# Patient Record
Sex: Male | Born: 1970 | Race: White | Hispanic: No | Marital: Single | Smoking: Never smoker
Health system: Southern US, Community
[De-identification: ages and names within clinical notes are randomized; demographics above are authoritative.]

## PROBLEM LIST (undated history)

## (undated) DIAGNOSIS — G8929 Other chronic pain: Secondary | ICD-10-CM

## (undated) DIAGNOSIS — M549 Dorsalgia, unspecified: Secondary | ICD-10-CM

## (undated) HISTORY — PX: OTHER SURGICAL HISTORY: SHX169

---

## 2008-03-12 ENCOUNTER — Emergency Department: Payer: Self-pay | Admitting: Emergency Medicine

## 2008-03-13 ENCOUNTER — Emergency Department (HOSPITAL_COMMUNITY): Admission: EM | Admit: 2008-03-13 | Discharge: 2008-03-13 | Payer: Self-pay | Admitting: Emergency Medicine

## 2010-05-04 LAB — CBC
RBC: 5.08 MIL/uL (ref 4.22–5.81)
WBC: 12.4 10*3/uL — ABNORMAL HIGH (ref 4.0–10.5)

## 2010-05-04 LAB — HEPATIC FUNCTION PANEL
AST: 37 U/L (ref 0–37)
Albumin: 3.7 g/dL (ref 3.5–5.2)
Alkaline Phosphatase: 56 U/L (ref 39–117)
Total Bilirubin: 2.2 mg/dL — ABNORMAL HIGH (ref 0.3–1.2)
Total Protein: 6.7 g/dL (ref 6.0–8.3)

## 2010-05-04 LAB — POCT I-STAT, CHEM 8
BUN: 13 mg/dL (ref 6–23)
Calcium, Ion: 1.01 mmol/L — ABNORMAL LOW (ref 1.12–1.32)
Chloride: 97 mEq/L (ref 96–112)
Glucose, Bld: 122 mg/dL — ABNORMAL HIGH (ref 70–99)
Potassium: 2.8 mEq/L — ABNORMAL LOW (ref 3.5–5.1)

## 2010-05-04 LAB — DIFFERENTIAL
Lymphocytes Relative: 15 % (ref 12–46)
Lymphs Abs: 1.9 10*3/uL (ref 0.7–4.0)
Monocytes Relative: 10 % (ref 3–12)
Neutro Abs: 9.3 10*3/uL — ABNORMAL HIGH (ref 1.7–7.7)
Neutrophils Relative %: 75 % (ref 43–77)

## 2010-05-04 LAB — URINALYSIS, ROUTINE W REFLEX MICROSCOPIC
Bilirubin Urine: NEGATIVE
Glucose, UA: NEGATIVE mg/dL
Ketones, ur: 40 mg/dL — AB
Specific Gravity, Urine: 1.024 (ref 1.005–1.030)
pH: 7 (ref 5.0–8.0)

## 2015-04-14 ENCOUNTER — Inpatient Hospital Stay
Admission: EM | Admit: 2015-04-14 | Discharge: 2015-04-16 | DRG: 603 | Disposition: A | Discharge status: Home / Self Care | Payer: Medicaid Other | Attending: Internal Medicine | Admitting: Internal Medicine

## 2015-04-14 ENCOUNTER — Encounter: Payer: Self-pay | Admitting: Emergency Medicine

## 2015-04-14 DIAGNOSIS — R609 Edema, unspecified: Secondary | ICD-10-CM

## 2015-04-14 DIAGNOSIS — L03115 Cellulitis of right lower limb: Principal | ICD-10-CM | POA: Diagnosis present

## 2015-04-14 DIAGNOSIS — M545 Low back pain: Secondary | ICD-10-CM | POA: Diagnosis present

## 2015-04-14 DIAGNOSIS — G8929 Other chronic pain: Secondary | ICD-10-CM | POA: Diagnosis present

## 2015-04-14 DIAGNOSIS — Z8052 Family history of malignant neoplasm of bladder: Secondary | ICD-10-CM

## 2015-04-14 HISTORY — DX: Dorsalgia, unspecified: M54.9

## 2015-04-14 HISTORY — DX: Other chronic pain: G89.29

## 2015-04-14 LAB — CBC WITH DIFFERENTIAL/PLATELET
BASOS ABS: 0.1 10*3/uL (ref 0–0.1)
BASOS PCT: 0 %
EOS ABS: 0.1 10*3/uL (ref 0–0.7)
Eosinophils Relative: 1 %
HCT: 40.6 % (ref 40.0–52.0)
HEMOGLOBIN: 14 g/dL (ref 13.0–18.0)
Lymphocytes Relative: 11 %
Lymphs Abs: 1.5 10*3/uL (ref 1.0–3.6)
MCH: 29.8 pg (ref 26.0–34.0)
MCHC: 34.4 g/dL (ref 32.0–36.0)
MCV: 86.7 fL (ref 80.0–100.0)
MONOS PCT: 12 %
Monocytes Absolute: 1.7 10*3/uL — ABNORMAL HIGH (ref 0.2–1.0)
NEUTROS PCT: 76 %
Neutro Abs: 11.1 10*3/uL — ABNORMAL HIGH (ref 1.4–6.5)
Platelets: 154 10*3/uL (ref 150–440)
RBC: 4.68 MIL/uL (ref 4.40–5.90)
RDW: 13 % (ref 11.5–14.5)
WBC: 14.5 10*3/uL — AB (ref 3.8–10.6)

## 2015-04-14 LAB — BASIC METABOLIC PANEL
Anion gap: 7 (ref 5–15)
BUN: 14 mg/dL (ref 6–20)
CHLORIDE: 103 mmol/L (ref 101–111)
CO2: 26 mmol/L (ref 22–32)
CREATININE: 0.87 mg/dL (ref 0.61–1.24)
Calcium: 8.7 mg/dL — ABNORMAL LOW (ref 8.9–10.3)
GFR calc non Af Amer: >60 mL/min (ref >60)
Glucose, Bld: 97 mg/dL (ref 65–99)
POTASSIUM: 4 mmol/L (ref 3.5–5.1)
SODIUM: 136 mmol/L (ref 135–145)

## 2015-04-14 MED ORDER — ENOXAPARIN SODIUM 40 MG/0.4ML ~~LOC~~ SOLN
40.0000 mg | SUBCUTANEOUS | Status: DC
  Administered 2015-04-15: 40 mg via SUBCUTANEOUS
  Filled 2015-04-14: qty 0.4

## 2015-04-14 MED ORDER — ACETAMINOPHEN 325 MG PO TABS: 650.0000 mg | ORAL_TABLET | Freq: Four times a day (QID) | ORAL | Status: DC | PRN

## 2015-04-14 MED ORDER — SODIUM CHLORIDE 0.9 % IV SOLN
INTRAVENOUS | Status: DC
  Administered 2015-04-14: 23:00:00 via INTRAVENOUS

## 2015-04-14 MED ORDER — SODIUM CHLORIDE 0.9 % IV SOLN
3.0000 g | Freq: Four times a day (QID) | INTRAVENOUS | Status: DC
  Administered 2015-04-14 – 2015-04-15 (×3): 3 g via INTRAVENOUS
  Filled 2015-04-14 (×6): qty 3

## 2015-04-14 MED ORDER — HYDROCODONE-ACETAMINOPHEN 5-325 MG PO TABS
1.0000 | ORAL_TABLET | ORAL | Status: DC | PRN
  Administered 2015-04-15 (×2): 1 via ORAL
  Filled 2015-04-14 (×2): qty 1

## 2015-04-14 MED ORDER — VANCOMYCIN HCL IN DEXTROSE 1-5 GM/200ML-% IV SOLN
1000.0000 mg | Freq: Once | INTRAVENOUS | Status: AC
  Administered 2015-04-14: 1000 mg via INTRAVENOUS
  Filled 2015-04-14: qty 200

## 2015-04-14 MED ORDER — MORPHINE SULFATE (PF) 2 MG/ML IV SOLN: 1.0000 mg | INTRAVENOUS | Status: DC | PRN

## 2015-04-14 MED ORDER — ACETAMINOPHEN 650 MG RE SUPP: 650.0000 mg | Freq: Four times a day (QID) | RECTAL | Status: DC | PRN

## 2015-04-14 MED ORDER — ONDANSETRON HCL 4 MG/2ML IJ SOLN: 4.0000 mg | Freq: Four times a day (QID) | INTRAMUSCULAR | Status: DC | PRN

## 2015-04-14 MED ORDER — ONDANSETRON HCL 4 MG PO TABS: 4.0000 mg | ORAL_TABLET | Freq: Four times a day (QID) | ORAL | Status: DC | PRN

## 2015-04-14 MED ORDER — VANCOMYCIN HCL IN DEXTROSE 1-5 GM/200ML-% IV SOLN
1000.0000 mg | Freq: Three times a day (TID) | INTRAVENOUS | Status: DC
  Administered 2015-04-14 – 2015-04-16 (×5): 1000 mg via INTRAVENOUS
  Filled 2015-04-14 (×7): qty 200

## 2015-04-14 MED ORDER — MORPHINE SULFATE (PF) 4 MG/ML IV SOLN
4.0000 mg | Freq: Once | INTRAVENOUS | Status: AC
  Administered 2015-04-14: 4 mg via INTRAVENOUS
  Filled 2015-04-14: qty 1

## 2015-04-14 MED ORDER — ONDANSETRON HCL 4 MG/2ML IJ SOLN
4.0000 mg | Freq: Once | INTRAMUSCULAR | Status: AC
  Administered 2015-04-14: 4 mg via INTRAVENOUS
  Filled 2015-04-14: qty 2

## 2015-04-14 NOTE — ED Notes (Signed)
Pt sent here from urgent care with right knee pain/redness/swelling. Pt reports yesterday he was giving a shot and pills of antibiotics and redness and swelling is still spreading.

## 2015-04-14 NOTE — ED Provider Notes (Signed)
[facility] Emergency Department Provider Note  ____________________________________________  Time seen: Approximately 3:51 PM  I have reviewed the triage vital signs and the nursing notes.   HISTORY  Chief Complaint Knee Pain    HPI Bascom LevelsMichael S Yanik is a 45 y.o. male with no chronic medical problems who presents for evaluation of 2 days right leg swelling, redness and pain, gradual onset, constant since onset, currently moderate, no modifying factors. Patient was seen at urgent care yesterday, diagnosed with cellulitis and discharged with PO clindamycin after receiving IV clindamycin however he reports that the redness has progressed beyond the borders that were marked by the urgent care physician. He has had subjective chills and fevers. No nausea, vomiting, diarrhea. No chest pain or difficulty breathing.   Past Medical History  Diagnosis Date  . Chronic back pain     Patient Active Problem List   Diagnosis Date Noted  . Cellulitis of right knee 04/14/2015    Past Surgical History  Procedure Laterality Date  . None      Current Outpatient Rx  Name  Route  Sig  Dispense  Refill  . clindamycin (CLEOCIN) 300 MG capsule   Oral   Take 300 mg by mouth 3 (three) times daily. For 10 days           Allergies Review of patient's allergies indicates no known allergies.  Family History  Problem Relation Age of Onset  . Bladder Cancer Mother     Social History Social History  Substance Use Topics  . Smoking status: Never Smoker   . Smokeless tobacco: None  . Alcohol Use: No    Review of Systems Constitutional: + fever/chills Eyes: No visual changes. ENT: No sore throat. Cardiovascular: Denies chest pain. Respiratory: Denies shortness of breath. Gastrointestinal: No abdominal pain.  No nausea, no vomiting.  No diarrhea.  No constipation. Genitourinary: Negative for dysuria. Musculoskeletal: Negative for back pain. Skin: Negative for  rash. Neurological: Negative for headaches, focal weakness or numbness.  10-point ROS otherwise negative.  ____________________________________________   PHYSICAL EXAM:  VITAL SIGNS: ED Triage Vitals  Enc Vitals Group     BP 04/14/15 1411 146/79 mmHg     Pulse Rate 04/14/15 1411 85     Resp 04/14/15 1411 16     Temp 04/14/15 1411 98.1 F (36.7 C)     Temp Source 04/14/15 1411 Oral     SpO2 04/14/15 1411 97 %     Weight 04/14/15 1411 210 lb (95.255 kg)     Height 04/14/15 1411 6\' 3"  (1.905 m)     Head Cir --      Peak Flow --      Pain Score 04/14/15 1412 8     Pain Loc --      Pain Edu? --      Excl. in GC? --     Constitutional: Alert and oriented. Well appearing and in no acute distress. Eyes: Conjunctivae are normal. PERRL. EOMI. Head: Atraumatic. Nose: No congestion/rhinnorhea. Mouth/Throat: Mucous membranes are moist.  Oropharynx non-erythematous. Neck: No stridor.  Supple without meningismus. Cardiovascular: Normal rate, regular rhythm. Grossly normal heart sounds.  Good peripheral circulation. Respiratory: Normal respiratory effort.  No retractions. Lungs CTAB. Gastrointestinal: Soft and nontender. No distention.  No CVA tenderness. Genitourinary: Deferred Musculoskeletal: There is an area of warmth, erythema, induration associated with the lower aspect of the right knee and the proximal aspect of the right lower leg. 2+ right DP pulse. Neurologic:  Normal  speech and language. No gross focal neurologic deficits are appreciated.  Skin:  Skin is warm, dry and intact. No rash noted. Psychiatric: Mood and affect are normal. Speech and behavior are normal.  ____________________________________________   LABS (all labs ordered are listed, but only abnormal results are displayed)  Labs Reviewed  CBC WITH DIFFERENTIAL/PLATELET - Abnormal; Notable for the following:    WBC 14.5 (*)    Neutro Abs 11.1 (*)    Monocytes Absolute 1.7 (*)    All other components within  normal limits  BASIC METABOLIC PANEL - Abnormal; Notable for the following:    Calcium 8.7 (*)    All other components within normal limits  CULTURE, BLOOD (ROUTINE X 2)  CULTURE, BLOOD (ROUTINE X 2)  CBC  CREATININE, SERUM  CBC  BASIC METABOLIC PANEL   ____________________________________________  EKG  none ____________________________________________  RADIOLOGY  none ____________________________________________   PROCEDURES  Procedure(s) performed: None  Critical Care performed: No  ____________________________________________   INITIAL IMPRESSION / ASSESSMENT AND PLAN / ED COURSE  Pertinent labs & imaging results that were available during my care of the patient were reviewed by me and considered in my medical decision making (see chart for details).  45 year old well with no chronic medical problems presenting with worsening right lower extremity cellulitis despite IV and by mouth clindamycin. He is also had subjective fevers and Chills. He does have leukocytosis here with a white blood cell count of approximately 14,000. Has full flexion and extension at the knee and though the cellulitis involves the inferior aspect of the right knee I do not suspect intra-articular involvement/septic joint. Blood cultures ordered. IV vancomycin ordered. Case discussed with the hospitalist, Dr. Allena Katz, for admission at 5:40 PM.   ____________________________________________   FINAL CLINICAL IMPRESSION(S) / ED DIAGNOSES  Final diagnoses:  Cellulitis of knee, right      Gayla Doss, MD 04/14/15 [id number]

## 2015-04-14 NOTE — Progress Notes (Signed)
Pharmacy Antibiotic Note  Tommy LevelsMichael S Baker is a 45 y.o. male admitted on 04/14/2015 with cellulitis failing outpatient abx.  Pharmacy has been consulted for vancomycin dosing.  Plan: Vancomycin 1000 IV every 8 hours with stacked dosing.  Goal trough 10-15 mcg/mL. Trough with the 5th total dose.   Unasyn 3 g iv q 6 hours.   Height: 6\' 3"  (190.5 cm) Weight: 210 lb (95.255 kg) IBW/kg (Calculated) : 84.5  Temp (24hrs), Avg:98.1 F (36.7 C), Min:98.1 F (36.7 C), Max:98.1 F (36.7 C)   Recent Labs Lab 04/14/15 1553  WBC 14.5*  CREATININE 0.87    Estimated Creatinine Clearance: 128.2 mL/min (by C-G formula based on Cr of 0.87).    No Known Allergies  Antimicrobials this admission: vancomycin 3/28 >>  Unasyn 3/28 >>   Dose adjustments this admission:   Microbiology results: 3/28 BCx: pending  Thank you for allowing pharmacy to be a part of this patient's care.  Tommy HartChristy, Tommy Baker 04/14/2015 6:06 PM

## 2015-04-14 NOTE — ED Notes (Addendum)
Pt reports Sunday morning he noticed he was unable to bear weight on his knee after working. Pt's R knee is swollen with redness. Was seen at urgent care and Dx with cellulitis. States leg is too stiff/painful to move easily.

## 2015-04-14 NOTE — H&P (Signed)
[facility] [place] [facility]   PATIENT NAME: Tommy GreaserMichael Baker    MR#:  [id number]  DATE OF BIRTH:  [birth year]  DATE OF ADMISSION:  04/14/2015  PRIMARY CARE PHYSICIAN: No primary care provider on file.   REQUESTING/REFERRING PHYSICIAN: Dr. Inocencio HomesGayle  CHIEF COMPLAINT:   Chief Complaint  Patient presents with  . Knee Pain    HISTORY OF PRESENT ILLNESS: Tommy GreaserMichael Baker  is a 45 y.o. 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 He states that he was putting the board last week on the floor and was on his knee. On Sunday he developed swelling and erythema in the knee the pain continued to persist. Therefore he was seen at a [facility] and was started on clindamycin. Patient's pain continued to get worse and the knee. Swelling continue to persist. And redness persisted therefore he comes to the ER. He is noted to have elevated WBC count. This is knee pain is not complaining of any chest pain or shortness of breath he is able to ambulate.  PAST MEDICAL HISTORY:   Past Medical History  Diagnosis Date  . Chronic back pain     PAST SURGICAL HISTORY: Past Surgical History  Procedure Laterality Date  . None      SOCIAL HISTORY:  Social History  Substance Use Topics  . Smoking status: Never Smoker   . Smokeless tobacco: Not on file  . Alcohol Use: No    FAMILY HISTORY:  Family History  Problem Relation Age of Onset  . Bladder Cancer Mother     DRUG ALLERGIES: No Known Allergies  REVIEW OF SYSTEMS:   CONSTITUTIONAL: No fever, fatigue or weakness.  EYES: No blurred or double vision.  EARS, NOSE, AND THROAT: No tinnitus or ear pain.  RESPIRATORY: No cough, shortness of breath, wheezing or hemoptysis.  CARDIOVASCULAR: No chest pain, orthopnea, edema.  GASTROINTESTINAL: No nausea, vomiting, diarrhea or abdominal pain.  GENITOURINARY: No dysuria, hematuria.  ENDOCRINE: No  polyuria, nocturia,  HEMATOLOGY: No anemia, easy bruising or bleeding SKIN: Right knee swelling and erythema MUSCULOSKELETAL: Right knee joint pain NEUROLOGIC: No tingling, numbness, weakness.  PSYCHIATRY: No anxiety or depression.   MEDICATIONS AT HOME:  Prior to Admission medications   Not on File      PHYSICAL EXAMINATION:   VITAL SIGNS: Blood pressure 132/68, pulse 78, temperature 98.1 F (36.7 C), temperature source Oral, resp. rate 16, height 6\' 3"  (1.905 m), weight 95.255 kg (210 lb), SpO2 99 %.  GENERAL:  45 y.o.-year-old patient lying in the bed with no acute distress.  EYES: Pupils equal, round, reactive to light and accommodation. No scleral icterus. Extraocular muscles intact.  HEENT: Head atraumatic, normocephalic. Oropharynx and nasopharynx clear.  NECK:  Supple, no jugular venous distention. No thyroid enlargement, no tenderness.  LUNGS: Normal breath sounds bilaterally, no wheezing, rales,rhonchi or crepitation. No use of accessory muscles of respiration.  CARDIOVASCULAR: S1, S2 normal. No murmurs, rubs, or gallops.  ABDOMEN: Soft, nontender, nondistended. Bowel sounds present. No organomegaly or mass.  EXTREMITIES: No clubbing, cyanosis or edema  NEUROLOGIC: Cranial nerves II through XII are intact. Muscle strength 5/5 in all extremities. Sensation intact. Gait not checked.  PSYCHIATRIC: The patient is alert and oriented x 3.  SKIN: Right knee is warm to touch erythematous mild swelling   LABORATORY PANEL:   CBC  Recent Labs Lab 04/14/15 1553  WBC 14.5*  HGB 14.0  HCT 40.6  PLT 154  MCV 86.7  MCH 29.8  MCHC 34.4  RDW 13.0  LYMPHSABS 1.5  MONOABS 1.7*  EOSABS 0.1  BASOSABS 0.1   ------------------------------------------------------------------------------------------------------------------  Chemistries   Recent Labs Lab 04/14/15 1553  NA 136  K 4.0  CL 103  CO2 26  GLUCOSE 97  BUN 14  CREATININE 0.87  CALCIUM 8.7*    ------------------------------------------------------------------------------------------------------------------ estimated creatinine clearance is 128.2 mL/min (by C-G formula based on Cr of 0.87). ------------------------------------------------------------------------------------------------------------------ No results for input(s): TSH, T4TOTAL, T3FREE, THYROIDAB in the last 72 hours.  Invalid input(s): FREET3   Coagulation profile No results for input(s): INR, PROTIME in the last 168 hours. ------------------------------------------------------------------------------------------------------------------- No results for input(s): DDIMER in the last 72 hours. -------------------------------------------------------------------------------------------------------------------  Cardiac Enzymes No results for input(s): CKMB, TROPONINI, MYOGLOBIN in the last 168 hours.  Invalid input(s): CK ------------------------------------------------------------------------------------------------------------------ Invalid input(s): POCBNP  ---------------------------------------------------------------------------------------------------------------  Urinalysis    Component Value Date/Time   COLORURINE AMBER BIOCHEMICALS MAY BE AFFECTED BY COLOR* 03/13/2008 0304   APPEARANCEUR CLOUDY* 03/13/2008 0304   LABSPEC 1.024 03/13/2008 0304   PHURINE 7.0 03/13/2008 0304   GLUCOSEU NEGATIVE 03/13/2008 0304   HGBUR NEGATIVE 03/13/2008 0304   BILIRUBINUR NEGATIVE 03/13/2008 0304   KETONESUR 40* 03/13/2008 0304   PROTEINUR NEGATIVE 03/13/2008 0304   UROBILINOGEN 1.0 03/13/2008 0304   NITRITE NEGATIVE 03/13/2008 0304   LEUKOCYTESUR  03/13/2008 0304    NEGATIVE MICROSCOPIC NOT DONE ON URINES WITH NEGATIVE PROTEIN, BLOOD, LEUKOCYTES, NITRITE, OR GLUCOSE <1000 mg/dL.     RADIOLOGY: No results found.  EKG: No orders found for this or any previous visit.  IMPRESSION AND PLAN: Patient is a  45 year old white male presents with right knee cellulitis  1. Right knee cellulitis at this time will treat with broad-spectrum antibiotics with vancomycin and Unasyn follow blood cultures  2. Leukocytosis due to cellulitis follow CBC  3. Miscellaneous Lovenox for DVT prophylaxis All the records are reviewed and case discussed with ED provider. Management plans discussed with the patient, family and they are in agreement.  CODE STATUS:full     Code Status Orders        Start     Ordered   04/14/15 1747  Full code   Continuous     04/14/15 1747    Code Status History    Date Active Date Inactive Code Status Order ID Comments User Context   This patient has a current code status but no historical code status.       TOTAL TIME TAKING CARE OF THIS PATIENT:55 minutes.    Auburn Bilberry M.D on 04/14/2015 at 5:54 PM  Between 7am to 6pm - Pager - [phone]  After 6pm go to [email or web address] - password [email or web address] [facility]  [place] [facility]  Office  [phone]  CC: Primary care physician; No primary care provider on file.

## 2015-04-15 ENCOUNTER — Inpatient Hospital Stay: Payer: Medicaid Other

## 2015-04-15 LAB — BASIC METABOLIC PANEL
Anion gap: 7 (ref 5–15)
BUN: 12 mg/dL (ref 6–20)
CO2: 26 mmol/L (ref 22–32)
CREATININE: 0.81 mg/dL (ref 0.61–1.24)
Calcium: 8.2 mg/dL — ABNORMAL LOW (ref 8.9–10.3)
Chloride: 105 mmol/L (ref 101–111)
GFR calc Af Amer: >60 mL/min (ref >60)
Glucose, Bld: 108 mg/dL — ABNORMAL HIGH (ref 65–99)
Potassium: 3.8 mmol/L (ref 3.5–5.1)
SODIUM: 138 mmol/L (ref 135–145)

## 2015-04-15 LAB — CBC
HCT: 38.9 % — ABNORMAL LOW (ref 40.0–52.0)
Hemoglobin: 13.4 g/dL (ref 13.0–18.0)
MCH: 30.3 pg (ref 26.0–34.0)
MCHC: 34.5 g/dL (ref 32.0–36.0)
MCV: 87.8 fL (ref 80.0–100.0)
PLATELETS: 148 10*3/uL — AB (ref 150–440)
RBC: 4.43 MIL/uL (ref 4.40–5.90)
RDW: 13.2 % (ref 11.5–14.5)
WBC: 11.8 10*3/uL — ABNORMAL HIGH (ref 3.8–10.6)

## 2015-04-15 NOTE — Progress Notes (Signed)
[facility] - [place] at [facility]   PATIENT NAME: Tommy Baker    MR#:  [id number]  DATE OF BIRTH:  Jan 15, 1971  SUBJECTIVE:  CHIEF COMPLAINT:   Chief Complaint  Patient presents with  . Knee Pain   - works in [employer], was kneeling on his knees yesterday and noted to have swelling, redness of right knee - failed outpatient clindamycin - Improving on IV ABX now  REVIEW OF SYSTEMS:  Review of Systems  Constitutional: Negative for fever and chills.  HENT: Negative for congestion, ear discharge and nosebleeds.   Eyes: Negative for blurred vision and double vision.  Respiratory: Negative for cough, shortness of breath and wheezing.   Cardiovascular: Positive for leg swelling. Negative for chest pain and palpitations.  Gastrointestinal: Negative for nausea, vomiting, abdominal pain, diarrhea and constipation.  Genitourinary: Negative for dysuria and urgency.  Musculoskeletal: Positive for joint pain. Negative for myalgias.       Right knee pain, swelling and erythema- Improving.  Skin: Negative for rash.  Neurological: Negative for dizziness, sensory change, speech change, focal weakness, seizures, weakness and headaches.  Psychiatric/Behavioral: Negative for depression.    DRUG ALLERGIES:  No Known Allergies  VITALS:  Blood pressure 108/67, pulse 65, temperature 98.4 F (36.9 C), temperature source Oral, resp. rate 18, height  (1.905 m), weight 95.255 kg (210 lb), SpO2 95 %.  PHYSICAL EXAMINATION:  Physical Exam  GENERAL:  45 y.o.-year-old patient lying in the bed with no acute distress.  EYES: Pupils equal, round, reactive to light and accommodation. No scleral icterus. Extraocular muscles intact.  HEENT: Head atraumatic, normocephalic. Oropharynx and nasopharynx clear.  NECK:  Supple, no jugular venous distention. No thyroid enlargement, no tenderness.  LUNGS: Normal breath sounds bilaterally, no wheezing, rales,rhonchi or  crepitation. No use of accessory muscles of respiration.  CARDIOVASCULAR: S1, S2 normal. No murmurs, rubs, or gallops.  ABDOMEN: Soft, nontender, nondistended. Bowel sounds present. No organomegaly or mass.  EXTREMITIES: Right knee erythema, swelling anteriorly on the knee extending inferiorly- improving since yesterday. No pedal edema, cyanosis, or clubbing.  NEUROLOGIC: Cranial nerves II through XII are intact. Muscle strength 5/5 in all extremities. Sensation intact. Gait not checked.  PSYCHIATRIC: The patient is alert and oriented x 3.  SKIN: No obvious rash, lesion, or ulcer.    LABORATORY PANEL:   CBC  Recent Labs Lab 04/15/15 0438  WBC 11.8*  HGB 13.4  HCT 38.9*  PLT 148*   ------------------------------------------------------------------------------------------------------------------  Chemistries   Recent Labs Lab 04/15/15 0438  NA 138  K 3.8  CL 105  CO2 26  GLUCOSE 108*  BUN 12  CREATININE 0.81  CALCIUM 8.2*   ------------------------------------------------------------------------------------------------------------------  Cardiac Enzymes No results for input(s): TROPONINI in the last 168 hours. ------------------------------------------------------------------------------------------------------------------  RADIOLOGY:  No results found.  EKG:  No orders found for this or any previous visit.  ASSESSMENT AND PLAN:   45 year old white male with PMH significant for chronic low back pain  presents with right knee cellulitis  1. Right knee cellulitis: secondary to recurrent trauma- kneels on his knees - X ray pending to r/o joint effusion and involvement - Blood cultures negative so far - On vancomycin and unasyn, If MRSA pcr is negative- discontinue vancomcyin - Improving. Continue monitoring and discharge tomorrow - [place] ordered to r/o DVT and also any ruptured knee cysts - discontinue fluids today  2. Leukocytosis due to cellulitis-  improving  3. Chronic low back pain- prn pain meds  4. DVT  prophylaxis- lovenox    All the records are reviewed and case discussed with Care Management/Social Workerr. Management plans discussed with the patient, family and they are in agreement.  CODE STATUS: Full Code  TOTAL TIME TAKING CARE OF THIS PATIENT: 37 minutes.   POSSIBLE D/C IN 1-2 DAYS, DEPENDING ON CLINICAL CONDITION.   Enid BaasKALISETTI,Ardella Chhim M.D on 04/15/2015 at 11:56 AM  Between 7am to 6pm - Pager - [phone]  After 6pm go to [email or web address] - password [email or web address]  [facility]  Office  [phone]  CC: Primary care physician; No primary care provider on file.

## 2015-04-16 MED ORDER — HYDROCODONE-ACETAMINOPHEN 5-325 MG PO TABS: 1.0000 | ORAL_TABLET | Freq: Four times a day (QID) | ORAL | Status: AC | PRN

## 2015-04-16 MED ORDER — SULFAMETHOXAZOLE-TRIMETHOPRIM 800-160 MG PO TABS: 1.0000 | ORAL_TABLET | Freq: Two times a day (BID) | ORAL | Status: AC

## 2015-04-16 NOTE — Progress Notes (Signed)
Pharmacy Antibiotic Note  Tommy Baker is a 45 y.o. male admitted on 04/14/2015 with cellulitis failing outpatient abx.  Pharmacy has been consulted for vancomycin dosing.  Plan: Vanc trough was drawn while infusing on 3/29. Vanc trough was not drawn on the AM of 3/30 per nurse bc patient is being d/c home today. D/c not indicated pt will be d/c on bactrim.   Height: 6\' 3"  (190.5 cm) Weight: 210 lb (95.255 kg) IBW/kg (Calculated) : 84.5  Temp (24hrs), Avg:98.3 F (36.8 C), Min:98 F (36.7 C), Max:98.5 F (36.9 C)   Recent Labs Lab 04/14/15 1553 04/15/15 0438  WBC 14.5* 11.8*  CREATININE 0.87 0.81    Estimated Creatinine Clearance: 137.6 mL/min (by C-G formula based on Cr of 0.81).    No Known Allergies  Antimicrobials this admission: vancomycin 3/28 >> 3/30 Unasyn 3/28 >> 3/29 Bactrim 3/30>>  Dose adjustments this admission:   Microbiology results: 3/28 BCx: neg  Thank you for allowing pharmacy to be a part of this patient's care.  Tommy FlossMelissa D Zeek Rostron 04/16/2015 10:31 AM

## 2015-04-16 NOTE — Discharge Summary (Signed)
[facility] -  at [facility]   PATIENT NAME: Tommy Baker    MR#:  [id number]  DATE OF BIRTH:  [birth year]  DATE OF ADMISSION:  04/14/2015 ADMITTING PHYSICIAN: Auburn Bilberry, MD  DATE OF DISCHARGE: 04/16/15  PRIMARY CARE PHYSICIAN: No primary care provider on file.    ADMISSION DIAGNOSIS:  Cellulitis of knee, right [L03.115]  DISCHARGE DIAGNOSIS:  Active Problems:   Cellulitis of right knee   SECONDARY DIAGNOSIS:   Past Medical History  Diagnosis Date  . Chronic back pain     HOSPITAL COURSE:   45 year old white male with PMH significant for chronic low back pain presents with right knee cellulitis  1. Right knee cellulitis: secondary to recurrent trauma- kneels on his knees - X ray negative for any joint effusion and involvement, [place] negative for DVT - Blood cultures negative so far - On vancomycin here- change to bactrim at discharge - Improving.  - ice pack, weight bearing as tolerated and keep the leg elevated.  2. Leukocytosis due to cellulitis- improving  3. Chronic low back pain- prn pain meds  Patient will be discharged today, If no improvement in 1 week- come to ER or ortho f/u   DISCHARGE CONDITIONS:   stable  CONSULTS OBTAINED:    None  DRUG ALLERGIES:  No Known Allergies  DISCHARGE MEDICATIONS:   Current Discharge Medication List    START taking these medications   Details  HYDROcodone-acetaminophen (NORCO/VICODIN) 5-325 MG tablet Take 1-2 tablets by mouth every 6 (six) hours as needed for moderate pain. Qty: 20 tablet, Refills: 0    sulfamethoxazole-trimethoprim (BACTRIM DS) 800-160 MG tablet Take 1 tablet by mouth 2 (two) times daily. X 9 more days Qty: 18 tablet, Refills: 0      STOP taking these medications     clindamycin (CLEOCIN) 300 MG capsule          DISCHARGE INSTRUCTIONS:   1. PCP f/u in 1-2 weeks 2. Keep the right leg elevated 3. Ice pack to right knee atleast 3-4  times/day  If you experience worsening of your admission symptoms, develop shortness of breath, life threatening emergency, suicidal or homicidal thoughts you must seek medical attention immediately by calling 911 or calling your MD immediately  if symptoms less severe.  You Must read complete instructions/literature along with all the possible adverse reactions/side effects for all the Medicines you take and that have been prescribed to you. Take any new Medicines after you have completely understood and accept all the possible adverse reactions/side effects.   Please note  You were cared for by a hospitalist during your hospital stay. If you have any questions about your discharge medications or the care you received while you were in the hospital after you are discharged, you can call the unit and asked to speak with the hospitalist on call if the hospitalist that took care of you is not available. Once you are discharged, your primary care physician will handle any further medical issues. Please note that NO REFILLS for any discharge medications will be authorized once you are discharged, as it is imperative that you return to your primary care physician (or establish a relationship with a primary care physician if you do not have one) for your aftercare needs so that they can reassess your need for medications and monitor your lab values.    Today   CHIEF COMPLAINT:   Chief Complaint  Patient presents with  . Knee Pain    VITAL  SIGNS:  Blood pressure 118/73, pulse 76, temperature 98.4 F (36.9 C), temperature source Oral, resp. rate 18, height 6\' 3"  (1.905 m), weight 95.255 kg (210 lb), SpO2 98 %.  I/O:   Intake/Output Summary (Last 24 hours) at 04/16/15 0942 Last data filed at 04/16/15 0532  Gross per 24 hour  Intake   1920 ml  Output    300 ml  Net   1620 ml    PHYSICAL EXAMINATION:   Physical Exam  GENERAL: 45 y.o.-year-old patient lying in the bed with no acute  distress.  EYES: Pupils equal, round, reactive to light and accommodation. No scleral icterus. Extraocular muscles intact.  HEENT: Head atraumatic, normocephalic. Oropharynx and nasopharynx clear.  NECK: Supple, no jugular venous distention. No thyroid enlargement, no tenderness.  LUNGS: Normal breath sounds bilaterally, no wheezing, rales,rhonchi or crepitation. No use of accessory muscles of respiration.  CARDIOVASCULAR: S1, S2 normal. No murmurs, rubs, or gallops.  ABDOMEN: Soft, nontender, nondistended. Bowel sounds present. No organomegaly or mass.  EXTREMITIES: Right knee erythema, swelling anteriorly on the knee extending inferiorly- improving since yesterday. No pedal edema, cyanosis, or clubbing.  NEUROLOGIC: Cranial nerves II through XII are intact. Muscle strength 5/5 in all extremities. Sensation intact. Gait not checked.  PSYCHIATRIC: The patient is alert and oriented x 3.  SKIN: No obvious rash, lesion, or ulcer.   DATA REVIEW:   CBC  Recent Labs Lab 04/15/15 0438  WBC 11.8*  HGB 13.4  HCT 38.9*  PLT 148*    Chemistries   Recent Labs Lab 04/15/15 0438  NA 138  K 3.8  CL 105  CO2 26  GLUCOSE 108*  BUN 12  CREATININE 0.81  CALCIUM 8.2*    Cardiac Enzymes No results for input(s): TROPONINI in the last 168 hours.  Microbiology Results  Results for orders placed or performed during the hospital encounter of 04/14/15  Blood culture (routine x 2)     Status: None (Preliminary result)   Collection Time: 04/14/15  5:22 PM  Result Value Ref Range Status   Specimen Description BLOOD LEFT ASSIST CONTROL  Final   Special Requests BAA,ANA,AER,5ML  Final   Culture NO GROWTH < 24 HOURS  Final   Report Status PENDING  Incomplete  Blood culture (routine x 2)     Status: None (Preliminary result)   Collection Time: 04/14/15  5:23 PM  Result Value Ref Range Status   Specimen Description BLOOD LEFT HAND  Final   Special Requests BAA,ANA,AER,5ML  Final    Culture NO GROWTH < 24 HOURS  Final   Report Status PENDING  Incomplete    RADIOLOGY:  [place] Venous Img Lower Unilateral Right  04/15/2015  CLINICAL DATA:  Right lower extremity pain and swelling for the past 3 days. EXAM: RIGHT LOWER EXTREMITY VENOUS DOPPLER ULTRASOUND TECHNIQUE: Gray-scale sonography with graded compression, as well as color Doppler and duplex ultrasound were performed to evaluate the lower extremity deep venous systems from the level of the common femoral vein and including the common femoral, femoral, profunda femoral, popliteal and calf veins including the posterior tibial, peroneal and gastrocnemius veins when visible. The superficial great saphenous vein was also interrogated. Spectral Doppler was utilized to evaluate flow at rest and with distal augmentation maneuvers in the common femoral, femoral and popliteal veins. COMPARISON:  None. FINDINGS: Contralateral Common Femoral Vein: Respiratory phasicity is normal and symmetric with the symptomatic side. No evidence of thrombus. Normal compressibility. Common Femoral Vein: No evidence of thrombus. Normal compressibility, respiratory  phasicity and response to augmentation. Saphenofemoral Junction: No evidence of thrombus. Normal compressibility and flow on color Doppler imaging. Profunda Femoral Vein: No evidence of thrombus. Normal compressibility and flow on color Doppler imaging. Femoral Vein: No evidence of thrombus. Normal compressibility, respiratory phasicity and response to augmentation. Popliteal Vein: No evidence of thrombus. Normal compressibility, respiratory phasicity and response to augmentation. Calf Veins: No evidence of thrombus. Normal compressibility and flow on color Doppler imaging. Superficial Great Saphenous Vein: No evidence of thrombus. Normal compressibility and flow on color Doppler imaging. Venous Reflux:  None. Other Findings:  None. IMPRESSION: No evidence of DVT within the right lower extremity. Electronically  Signed   By: Simonne Come M.D.   On: 04/15/2015 15:20   Dg Knee Complete 4 Views Right  04/15/2015  CLINICAL DATA:  Knee pain, swelling and erythema after kneeling on knees at work yesterday. Initial encounter. EXAM: RIGHT KNEE - COMPLETE 4+ VIEW COMPARISON:  None. FINDINGS: The mineralization and alignment are normal. There is no evidence of acute fracture or dislocation. The joint spaces are maintained. There is mild patellar spurring. No significant joint effusion. The prepatellar soft tissues appear mildly edematous. No evidence of foreign body or soft tissue emphysema. IMPRESSION: No acute osseous findings. Prepatellar soft tissue edema without evidence of foreign body. Electronically Signed   By: Carey Bullocks M.D.   On: 04/15/2015 12:56    EKG:  No orders found for this or any previous visit.    Management plans discussed with the patient, family and they are in agreement.  CODE STATUS:     Code Status Orders        Start     Ordered   04/14/15 1747  Full code   Continuous     04/14/15 1747    Code Status History    Date Active Date Inactive Code Status Order ID Comments User Context   This patient has a current code status but no historical code status.      TOTAL TIME TAKING CARE OF THIS PATIENT: 38 minutes.    Enid Baas M.D on 04/16/2015 at 9:42 AM  Between 7am to 6pm - Pager - [phone]  After 6pm go to [email or web address] - password [email or web address] [facility]  [facility]  Office  [phone]  CC: Primary care physician; No primary care provider on file.

## 2015-04-16 NOTE — Progress Notes (Signed)
Lab called to report vancomycin level. Dose documented at 2316 and trough drawn at 2350. Level is inaccurate due to being drawn as dose administered. I asked lab to credit the charge and will reenter lab for before next dose.  Orris Perin A. Sheltonookson, [place]arm[place]D., BCPS Clinical Pharmacist [phone] 04/16/2015

## 2015-04-16 NOTE — Progress Notes (Signed)
DISCHARGE NOTE:  Pt given discharge instructions and prescriptions. Pt verbalized understanding. Pt wheeled to car by staff.  

## 2015-04-19 LAB — CULTURE, BLOOD (ROUTINE X 2)
CULTURE: NO GROWTH
Culture: NO GROWTH

## 2015-04-27 ENCOUNTER — Encounter: Payer: Self-pay | Admitting: Emergency Medicine

## 2015-04-27 ENCOUNTER — Emergency Department
Admission: EM | Admit: 2015-04-27 | Discharge: 2015-04-27 | Disposition: A | Discharge status: Home / Self Care | Payer: BLUE CROSS/BLUE SHIELD | Attending: Emergency Medicine | Admitting: Emergency Medicine

## 2015-04-27 DIAGNOSIS — M719 Bursopathy, unspecified: Secondary | ICD-10-CM | POA: Diagnosis not present

## 2015-04-27 DIAGNOSIS — L03115 Cellulitis of right lower limb: Secondary | ICD-10-CM | POA: Diagnosis present

## 2015-04-27 MED ORDER — OXYCODONE-ACETAMINOPHEN 5-325 MG PO TABS: 2.0000 | ORAL_TABLET | Freq: Four times a day (QID) | ORAL | Status: DC | PRN

## 2015-04-27 NOTE — ED Notes (Addendum)
States he was hospitalized for cellulitis for his right knee, and now states right knee pain and swelling, states he finished his abx, states he even took some clyndmaycain that he had left from the walk in clinic as well, no redness noted to knee area

## 2015-04-27 NOTE — ED Provider Notes (Signed)
[facility] Emergency Department Provider Note     Time seen: ----------------------------------------- 2:47 PM on 04/27/2015 -----------------------------------------    I have reviewed the triage vital signs and the nursing notes.   HISTORY  Chief Complaint No chief complaint on file.    HPI Tommy Baker is a 45 y.o. male who presents ER for right knee pain and swelling. Patient states he was recently hospitalized for cellulitis, and he is concerned for recurrence of infection. 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 Patient is concerned for recurrence of infection.   Past Medical History  Diagnosis Date  . Chronic back pain     Patient Active Problem List   Diagnosis Date Noted  . Cellulitis of right knee 04/14/2015    Past Surgical History  Procedure Laterality Date  . None      Allergies Review of patient's allergies indicates no known allergies.  Social History Social History  Substance Use Topics  . Smoking status: Never Smoker   . Smokeless tobacco: None  . Alcohol Use: No    Review of Systems Constitutional: Negative for fever. Musculoskeletal: Positive for right knee pain and swelling Skin: Negative for rash. Neurological: Negative for headaches, focal weakness or numbness. ____________________________________________   PHYSICAL EXAM:  VITAL SIGNS: ED Triage Vitals  Enc Vitals Group     BP 04/27/15 1223 121/93 mmHg     Pulse Rate 04/27/15 1223 102     Resp 04/27/15 1223 16     Temp 04/27/15 1223 98.1 F (36.7 C)     Temp Source 04/27/15 1223 Oral     SpO2 04/27/15 1223 96 %     Weight 04/27/15 1223 210 lb (95.255 kg)     Height 04/27/15 1223 6\' 3"  (1.905 m)     Head Cir --      Peak Flow --      Pain Score 04/27/15 1224 2     Pain Loc --      Pain Edu? --      Excl. in GC? --    Constitutional: Alert and oriented. Well appearing and in no  distress. Musculoskeletal: Mildly tender right knee, infrapatellar swelling. No fluid is appreciated in the knee joint Neurologic:  Normal speech and language.  Skin:  Skin is warm, dry and intact. No rash noted. Psychiatric: Mood and affect are normal. Speech and behavior are normal.  ____________________________________________  ED COURSE:  Pertinent labs & imaging results that were available during my care of the patient were reviewed by me and considered in my medical decision making (see chart for details). Patient is in no acute distress, this is clinically bursitis. This is likely aggravated by the work that he does. I will discuss with orthopedics. ____________________________________________  FINAL ASSESSMENT AND PLAN  Bursitis  Plan: Patient clinically with bursitis. I have discussed with Dr. Rosita KeaMenz who recommends just follow-up in the office. No oral or injectable steroids. I'll advise rest ice compression and elevation. He is stable for outpatient follow-up.   Emily FilbertWilliams, Jonathan E, MD   Emily FilbertJonathan E Williams, MD 04/27/15 [phone]

## 2015-04-27 NOTE — Discharge Instructions (Signed)
Bursitis °Bursitis is inflammation and irritation of a bursa, which is one of the small, fluid-filled sacs that cushion and protect the moving parts of your body. These sacs are located between bones and muscles, muscle attachments, or skin areas next to bones. A bursa protects these structures from the wear and tear that results from frequent movement. °An inflamed bursa causes pain and swelling. Fluid may build up inside the sac. Bursitis is most common near joints, especially the knees, elbows, hips, and shoulders. °CAUSES °Bursitis can be caused by:  °· Injury from: °¨ A direct blow, like falling on your knee or elbow. °¨ Overuse of a joint (repetitive stress). °· Infection. This can happen if bacteria gets into a bursa through a cut or scrape near a joint. °· Diseases that cause joint inflammation, such as gout and rheumatoid arthritis. °RISK FACTORS °You may be at risk for bursitis if you:  °· Have a job or hobby that involves a lot of repetitive stress on your joints. °· Have a condition that weakens your body's defense system (immune system), such as diabetes, cancer, or HIV. °· Lift and reach overhead often. °· Kneel or lean on hard surfaces often. °· Run or walk often. °SIGNS AND SYMPTOMS °The most common signs and symptoms of bursitis are: °· Pain that gets worse when you move the affected body part or put weight on it. °· Inflammation. °· Stiffness. °Other signs and symptoms may include: °· Redness. °· Tenderness. °· Warmth. °· Pain that continues after rest. °· Fever and chills. This may occur in bursitis caused by infection. °DIAGNOSIS °Bursitis may be diagnosed by:  °· Medical history and physical exam. °· MRI. °· A procedure to drain fluid from the bursa with a needle (aspiration). The fluid may be checked for signs of infection or gout. °· Blood tests to rule out other causes of inflammation. °TREATMENT  °Bursitis can usually be treated at home with rest, ice, compression, and elevation (RICE). For  mild bursitis, RICE treatment may be all you need. Other treatments may include: °· Nonsteroidal anti-inflammatory drugs (NSAIDs) to treat pain and inflammation. °· Corticosteroids to fight inflammation. You may have these drugs injected into and around the area of bursitis. °· Aspiration of bursitis fluid to relieve pain and improve movement. °· Antibiotic medicine to treat an infected bursa. °· A splint, brace, or walking aid. °· Physical therapy if you continue to have pain or limited movement. °· Surgery to remove a damaged or infected bursa. This may be needed if you have a very bad case of bursitis or if other treatments have not worked. °HOME CARE INSTRUCTIONS  °· Take medicines only as directed by your health care provider. °· If you were prescribed an antibiotic medicine, finish it all even if you start to feel better. °· Rest the affected area as directed by your health care provider. °¨ Keep the area elevated. °¨ Avoid activities that make pain worse. °· Apply ice to the injured area: °¨ Place ice in a plastic bag. °¨ Place a towel between your skin and the bag. °¨ Leave the ice on for 20 minutes, 2-3 times a day. °· Use splints, braces, pads, or walking aids as directed by your health care provider. °· Keep all follow-up visits as directed by your health care provider. This is important. °PREVENTION  °· Wear knee pads if you kneel often. °· Wear sturdy running or walking shoes that fit you well. °· Take regular breaks from repetitive activity. °· Warm   up by stretching before doing any strenuous activity. °· Maintain a healthy weight or lose weight as recommended by your health care provider. Ask your health care provider if you need help. °· Exercise regularly. Start any new physical activity gradually. °SEEK MEDICAL CARE IF:  °· Your bursitis is not responding to treatment or home care. °· You have a fever. °· You have chills. °  °This information is not intended to replace advice given to you by your  health care provider. Make sure you discuss any questions you have with your health care provider. °  °Document Released: 01/01/2000 Document Revised: 09/24/2014 Document Reviewed: 03/25/2013 °Elsevier Interactive Patient Education ©2016 [employer] ° °

## 2015-04-27 NOTE — ED Notes (Signed)
Seen in ED and was hospitalized last week for right knee cellulitis.  D/C last Wednesday and finished antibiotics.  Has been seen by [facility] and was given a shot of antibiotics.   C/O pain and swelling to right knee.

## 2017-05-04 ENCOUNTER — Emergency Department
Admission: EM | Admit: 2017-05-04 | Discharge: 2017-05-04 | Disposition: A | Discharge status: Home / Self Care | Payer: BLUE CROSS/BLUE SHIELD | Attending: Emergency Medicine | Admitting: Emergency Medicine

## 2017-05-04 ENCOUNTER — Emergency Department: Payer: BLUE CROSS/BLUE SHIELD

## 2017-05-04 DIAGNOSIS — Y939 Activity, unspecified: Secondary | ICD-10-CM | POA: Insufficient documentation

## 2017-05-04 DIAGNOSIS — S2231XA Fracture of one rib, right side, initial encounter for closed fracture: Secondary | ICD-10-CM | POA: Insufficient documentation

## 2017-05-04 DIAGNOSIS — Y999 Unspecified external cause status: Secondary | ICD-10-CM | POA: Insufficient documentation

## 2017-05-04 DIAGNOSIS — Y929 Unspecified place or not applicable: Secondary | ICD-10-CM | POA: Insufficient documentation

## 2017-05-04 DIAGNOSIS — W19XXXA Unspecified fall, initial encounter: Secondary | ICD-10-CM

## 2017-05-04 DIAGNOSIS — W01198A Fall on same level from slipping, tripping and stumbling with subsequent striking against other object, initial encounter: Secondary | ICD-10-CM | POA: Insufficient documentation

## 2017-05-04 MED ORDER — DOCUSATE SODIUM 100 MG PO CAPS
100.0000 mg | ORAL_CAPSULE | Freq: Every day | ORAL | 2 refills | Status: AC | PRN
Start: 2017-05-04 — End: 2018-05-04

## 2017-05-04 MED ORDER — HYDROMORPHONE HCL 1 MG/ML IJ SOLN
1.0000 mg | Freq: Once | INTRAMUSCULAR | Status: AC
  Administered 2017-05-04: 1 mg via INTRAMUSCULAR

## 2017-05-04 MED ORDER — OXYCODONE-ACETAMINOPHEN 5-325 MG PO TABS
ORAL_TABLET | ORAL | Status: AC
  Filled 2017-05-04: qty 2

## 2017-05-04 MED ORDER — OXYCODONE-ACETAMINOPHEN 5-325 MG PO TABS
2.0000 | ORAL_TABLET | Freq: Once | ORAL | Status: AC
  Administered 2017-05-04: 2 via ORAL

## 2017-05-04 MED ORDER — OXYCODONE-ACETAMINOPHEN 5-325 MG PO TABS: 1.0000 | ORAL_TABLET | ORAL | 0 refills | Status: AC | PRN

## 2017-05-04 MED ORDER — HYDROMORPHONE HCL 1 MG/ML IJ SOLN
INTRAMUSCULAR | Status: AC
  Filled 2017-05-04: qty 1

## 2017-05-04 NOTE — ED Triage Notes (Signed)
Patient reports he fell from standing height onto a 2'8' board and injured his right rib cage. Patient c/o pain to area with movement. Patient rates pain at 0 of 10 when stationary, 10 of 10 with movement/palpation. Patient's respirations even, unlabored. Patient's oxygen saturation 99% on RA. Equal lungs sounds bilaterally on auscultation

## 2017-05-04 NOTE — Discharge Instructions (Signed)
USe  incentive spirometry without fail, every 15 minutes while awake, return to the emergency room for any cough, shortness of breath, abdominal pain or if you feel worse in any way.

## 2017-05-04 NOTE — ED Provider Notes (Addendum)
[facility] Emergency Department Provider Note  ____________________________________________   I have reviewed the triage vital signs and the nursing notes. Where available I have reviewed prior notes and, if possible and indicated, outside hospital notes.    HISTORY  Chief Complaint Fall    HPI Tommy Baker is a 47 y.o. male who presents today complaining of right rib pain.  He fell 5 or 6 hours ago, landed on his ribs.  He has no abdominal pain no other symptoms but hurts to move and he feels the rib move and grind against itself.  He is able to breathe with no difficulty.  Non-syncopal fall did not hit his head no other injury, fell from standing.  Just lost his footing.  Past Medical History:  Diagnosis Date  . Chronic back pain     Patient Active Problem List   Diagnosis Date Noted  . Cellulitis of right knee 04/14/2015    Past Surgical History:  Procedure Laterality Date  . none      Prior to Admission medications   Medication Sig Start Date End Date Taking? Authorizing Provider  HYDROcodone-acetaminophen (NORCO/VICODIN) 5-325 MG tablet Take 1-2 tablets by mouth every 6 (six) hours as needed for moderate pain. 04/16/15   Enid Baas, MD  oxyCODONE-acetaminophen (PERCOCET) 5-325 MG tablet Take 2 tablets by mouth every 6 (six) hours as needed for moderate pain or severe pain. 04/27/15   Emily Filbert, MD  sulfamethoxazole-trimethoprim (BACTRIM DS) 800-160 MG tablet Take 1 tablet by mouth 2 (two) times daily. X 9 more days 04/16/15   Enid Baas, MD    Allergies Patient has no known allergies.  Family History  Problem Relation Age of Onset  . Bladder Cancer Mother     Social History Social History   Tobacco Use  . Smoking status: Never Smoker  . Smokeless tobacco: Never Used  Substance Use Topics  . Alcohol use: Yes    Alcohol/week: 0.0 oz  . Drug use: No    Review of Systems Constitutional: No  fever/chills Eyes: No visual changes. ENT: No sore throat. No stiff neck no neck pain Cardiovascular: Positive rib pain Respiratory: Denies shortness of breath. Gastrointestinal:   no vomiting.  No diarrhea.  No constipation. Genitourinary: Negative for dysuria. Musculoskeletal: Negative lower extremity swelling Skin: Negative for rash. Neurological: Negative for severe headaches, focal weakness or numbness.   ____________________________________________   PHYSICAL EXAM:  VITAL SIGNS: ED Triage Vitals [05/04/17 2130]  Enc Vitals Group     BP 119/82     Pulse Rate 81     Resp 19     Temp 98.3 F (36.8 C)     Temp Source Oral     SpO2 99 %     Weight 200 lb (90.7 kg)     Height 6\' 3"  (1.905 m)     Head Circumference      Peak Flow      Pain Score 10     Pain Loc      Pain Edu?      Excl. in GC?     Constitutional: Alert and oriented.  Patient is in pain and uncomfortable, but in otherwise no acute distress Eyes: Conjunctivae are normal Head: Atraumatic HEENT: No congestion/rhinnorhea. Mucous membranes are moist.  Oropharynx non-erythematous Neck:   Nontender with no meningismus, no masses, no stridor Cardiovascular: Normal rate, regular rhythm. Grossly normal heart sounds.  Good peripheral circulation. Chest: Tender to palpation to the chest wall when  I touch that area patient states "ouch that the pain right there, on the right very specific area just behind the ciliary line.  There is absolutely no tenderness to palpation to his CVA region or to his liver on deep repeated attempts Respiratory: Normal respiratory effort.  No retractions. Lungs CTAB. Abdominal: Soft and nontender. No distention. No guarding no rebound Back:  There is no focal tenderness or step off.  there is no midline tenderness there are no lesions noted. there is no CVA tenderness Musculoskeletal: No lower extremity tenderness, no upper extremity tenderness. No joint effusions, no DVT signs strong  distal pulses no edema Neurologic:  Normal speech and language. No gross focal neurologic deficits are appreciated.  Skin:  Skin is warm, dry and intact. No rash noted. Psychiatric: Mood and affect are normal. Speech and behavior are normal.  ____________________________________________   LABS (all labs ordered are listed, but only abnormal results are displayed)  Labs Reviewed - No data to display  Pertinent labs  results that were available during my care of the patient were reviewed by me and considered in my medical decision making (see chart for details). ____________________________________________  EKG  I personally interpreted any EKGs ordered by me or triage  ____________________________________________  RADIOLOGY  Pertinent labs & imaging results that were available during my care of the patient were reviewed by me and considered in my medical decision making (see chart for details). If possible, patient and/or family made aware of any abnormal findings.  Dg Ribs Unilateral W/chest Right  Result Date: 05/04/2017 CLINICAL DATA:  Patient fell onto 082 foot 8 inch board injuring right rib cage. EXAM: RIGHT RIBS AND CHEST - 3+ VIEW COMPARISON:  None. FINDINGS: Linear lucency involving the anterior right eleventh rib compatible with a nondisplaced fracture. There is no evidence of pneumothorax or pleural effusion. Both lungs are clear. Heart size and mediastinal contours are within normal limits. IMPRESSION: Nondisplaced anterior right eleventh rib fracture. Electronically Signed   By: Tollie Eth M.D.   On: 05/04/2017 22:24   ____________________________________________    PROCEDURES  Procedure(s) performed: None  Procedures  Critical Care performed: None  ____________________________________________   INITIAL IMPRESSION / ASSESSMENT AND PLAN / ED COURSE  Pertinent labs & imaging results that were available during my care of the patient were reviewed by me and  considered in my medical decision making (see chart for details).  he is here with rib pain after a fall, no evidence of pneumothorax or intra-abdominal or retroperitoneal or renal injury, isolated rib fracture, we will treat him with pain medications, incentive spirometry and close follow-up.  ----------------------------------------- 10:39 PM on 05/04/2017 -----------------------------------------  My hope we could treat him with oral pain medications, but family really feels that he should have "a shot" this is what she apparently gets when she has pain..  Obviously this will not last as long but hopefully we can take the edge off his discomfort.  Also given him oral pain medications for his rib pain.  X-ray shows a single isolated rib fracture which is consistent with exam.   ----------------------------------------- 11:09 PM on 05/04/2017 -----------------------------------------  And feels better after Dilaudid, he is awake and alert his wife will drive home he understands he cannot drive and Percocet were on Dilaudid.  Wife understands this as well.  Serial abdominal exams are benign, no evidence of flank pain, patient declines to give urine sample at this time, would prefer to go home.  We also offered admission to the  hospital which she declined.  I did instruct him that if he has any hematuria, abdominal pain, shortness of breath cough or other concerning symptoms he should return to the emergency room.  I did send him home with incentive spirometry.  Patient was offered admission to the hospital for pain control and he declined and at this time is feeling better. I also offered ct scan to ensure other injury, of which I have very low suspicion, but he declines that as well, which I do not think is unreasonable.  Most of the stress he states comes from the inconvenience this will cause him in his job.  He understands he must return to the emergency room should he feel worse.  His pain is  cephalic to anything likely to cause renal injury ____________________________________________   FINAL CLINICAL IMPRESSION(S) / ED DIAGNOSES  Final diagnoses:  None      This chart was dictated using voice recognition software.  Despite best efforts to proofread,  errors can occur which can change meaning.      Jeanmarie PlantMcShane, Temari Schooler A, MD 05/04/17 2238    Jeanmarie PlantMcShane, Meera Vasco A, MD 05/04/17 2240    Jeanmarie PlantMcShane, Kiley Torrence A, MD 05/04/17 2250    Jeanmarie PlantMcShane, Annet Manukyan A, MD 05/04/17 2310    Jeanmarie PlantMcShane, Deondrick Searls A, MD 05/04/17 [phone]

## 2018-08-22 IMAGING — CR DG RIBS W/ CHEST 3+V*R*
5 series · 5 of 5 positions shown · non-contrast
Comparison: None.

CLINICAL DATA: Patient fell onto 082 foot 8 inch board injuring
right rib cage.

EXAM:
RIGHT RIBS AND CHEST - 3+ VIEW

[chest pa]
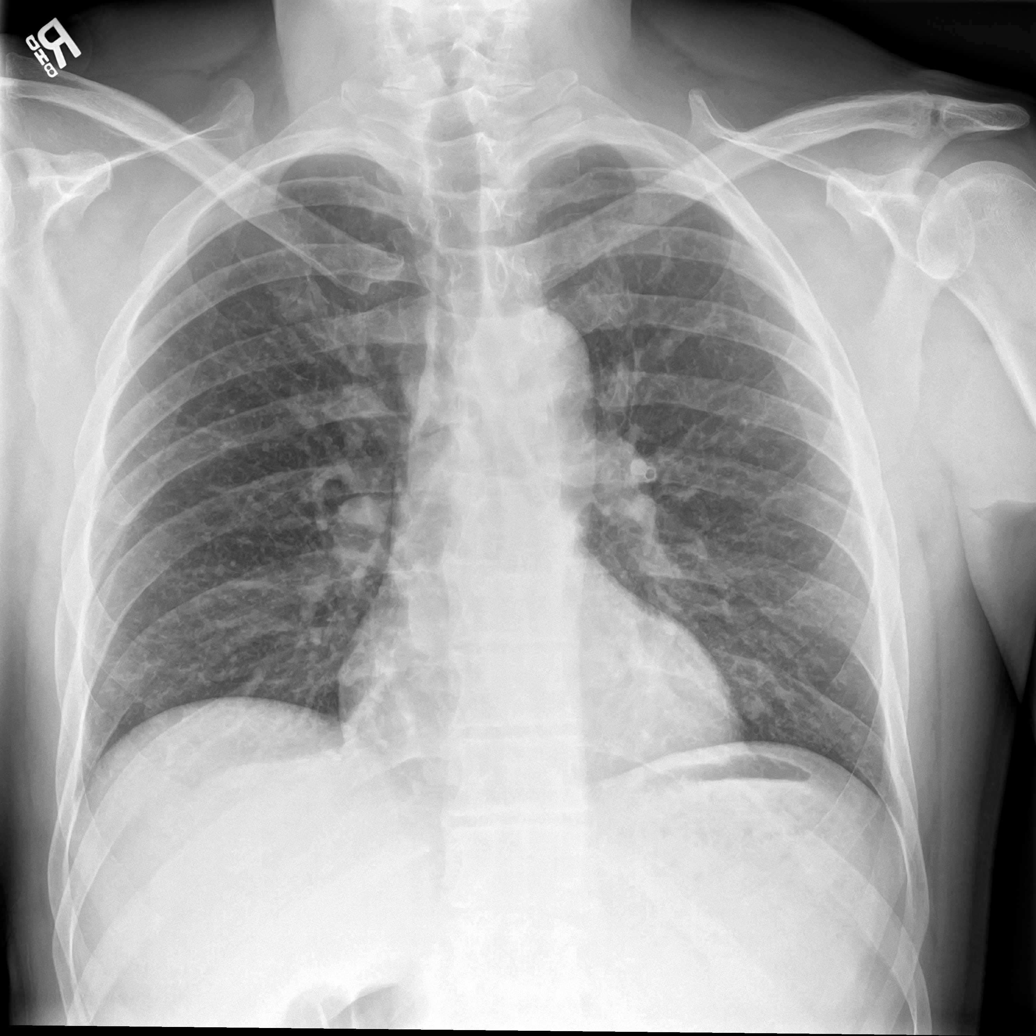

[rib pa]
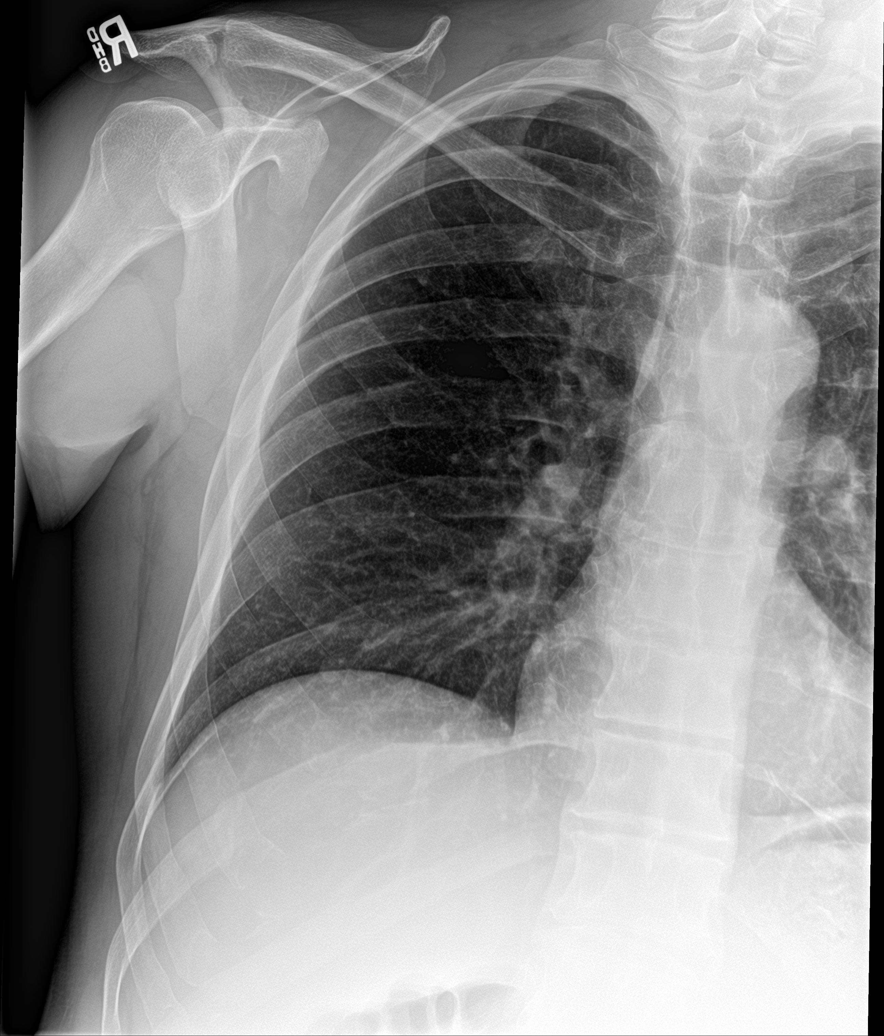

[rib pa obl (1 of 2)]
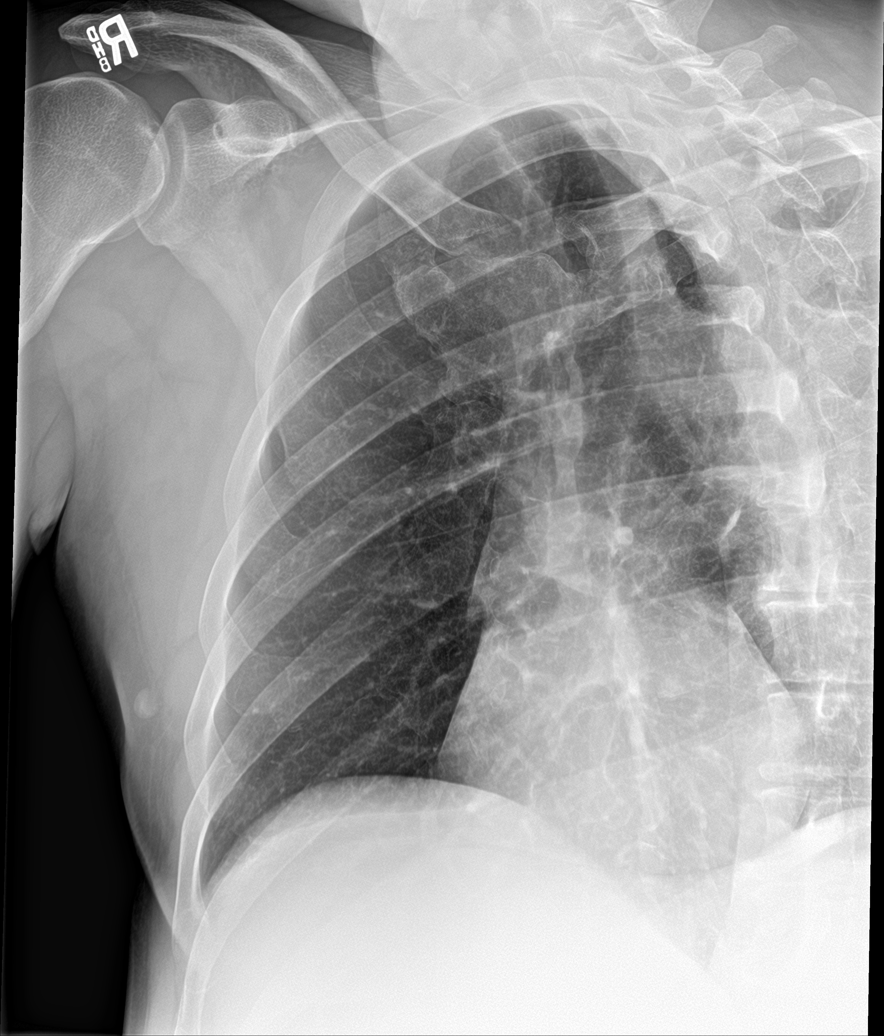

[rib ap]
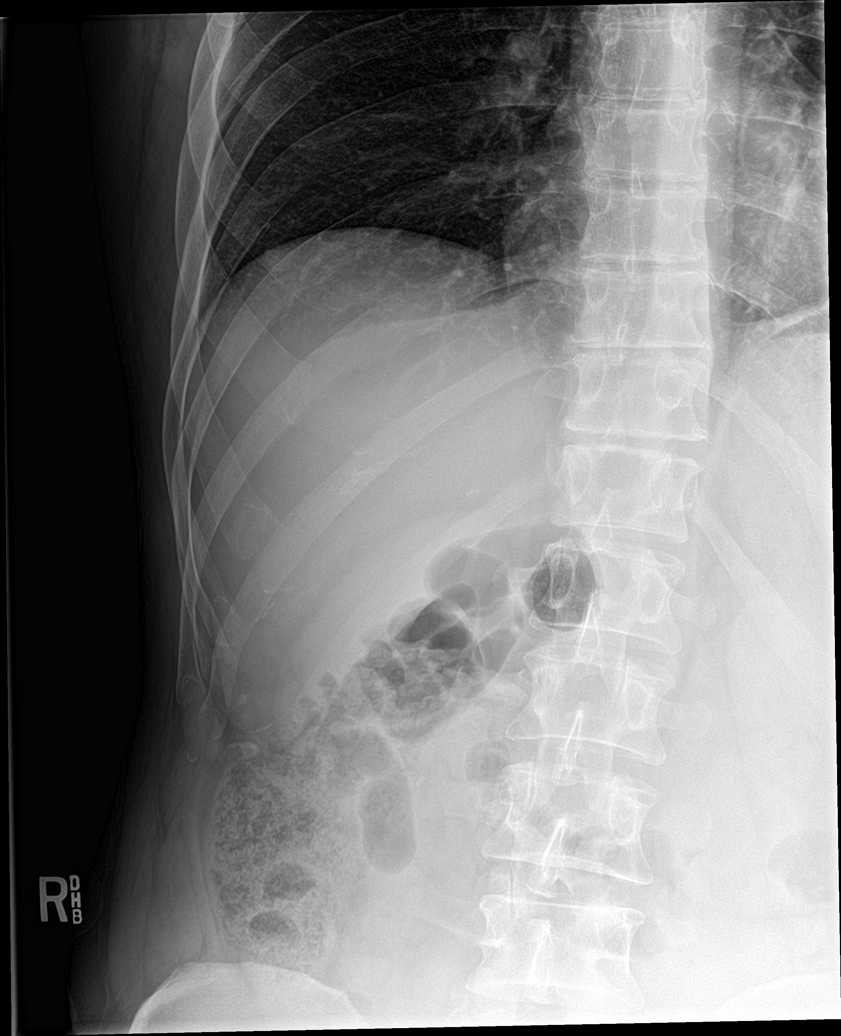

[rib pa obl (2 of 2)]
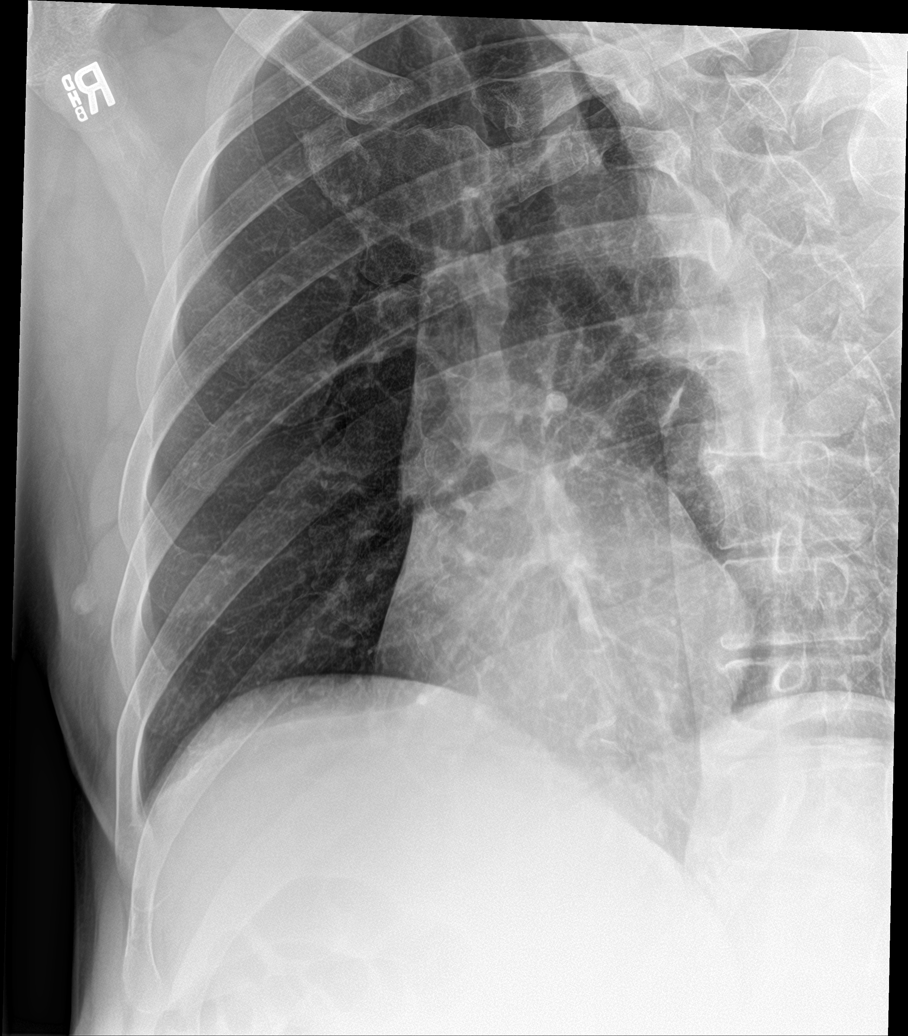

[5 of 5 positions shown; findings below may reference images not displayed]

FINDINGS: Linear lucency involving the anterior right eleventh rib compatible
with a nondisplaced fracture. There is no evidence of pneumothorax
or pleural effusion. Both lungs are clear. Heart size and
mediastinal contours are within normal limits.
IMPRESSION: Nondisplaced anterior right eleventh rib fracture.
